# Patient Record
Sex: Female | Born: 1985
Health system: Southern US, Community
[De-identification: ages and names within clinical notes are randomized; demographics above are authoritative.]

---

## 2006-11-18 ENCOUNTER — Emergency Department (HOSPITAL_COMMUNITY): Admission: EM | Admit: 2006-11-18 | Discharge: 2006-11-18 | Payer: Self-pay | Admitting: Emergency Medicine

## 2006-11-20 ENCOUNTER — Emergency Department (HOSPITAL_COMMUNITY): Admission: EM | Admit: 2006-11-20 | Discharge: 2006-11-21 | Payer: Self-pay | Admitting: Emergency Medicine

## 2009-03-10 ENCOUNTER — Encounter: Admission: RE | Admit: 2009-03-10 | Discharge: 2009-03-10 | Payer: Self-pay | Admitting: Family Medicine

## 2009-05-08 ENCOUNTER — Emergency Department (HOSPITAL_COMMUNITY): Admission: EM | Admit: 2009-05-08 | Discharge: 2009-05-08 | Payer: Self-pay | Admitting: Emergency Medicine

## 2010-05-23 ENCOUNTER — Encounter: Payer: Self-pay | Admitting: Family Medicine

## 2011-02-14 LAB — BASIC METABOLIC PANEL
BUN: 12
CO2: 25
Calcium: 9.6
Creatinine, Ser: 1.01
Glucose, Bld: 78

## 2011-02-14 LAB — POCT PREGNANCY, URINE: Preg Test, Ur: NEGATIVE

## 2011-02-14 LAB — CBC
HCT: 42.3
MCHC: 34
Platelets: 336
RDW: 13.9
RDW: 13.9

## 2011-02-14 LAB — URINALYSIS, ROUTINE W REFLEX MICROSCOPIC
Hgb urine dipstick: NEGATIVE
Nitrite: NEGATIVE
Protein, ur: NEGATIVE
Specific Gravity, Urine: 1.026
Urobilinogen, UA: 0.2
Urobilinogen, UA: 0.2

## 2011-02-14 LAB — DIFFERENTIAL
Basophils Absolute: 0.2 — ABNORMAL HIGH
Basophils Absolute: 0.2 — ABNORMAL HIGH
Basophils Relative: 1
Eosinophils Relative: 1
Lymphocytes Relative: 29
Neutro Abs: 8.5 — ABNORMAL HIGH
Neutrophils Relative %: 55
Neutrophils Relative %: 64

## 2011-02-14 LAB — WET PREP, GENITAL
Trich, Wet Prep: NONE SEEN
Yeast Wet Prep HPF POC: NONE SEEN

## 2011-02-14 LAB — I-STAT 8, (EC8 V) (CONVERTED LAB)
Chloride: 106
Glucose, Bld: 84
Potassium: 4.3
TCO2: 29
pH, Ven: 7.382 — ABNORMAL HIGH

## 2011-02-14 LAB — POCT I-STAT CREATININE: Operator id: 196461

## 2011-02-14 LAB — GC/CHLAMYDIA PROBE AMP, GENITAL: GC Probe Amp, Genital: NEGATIVE

## 2011-02-14 LAB — URINE MICROSCOPIC-ADD ON

## 2012-05-17 DIAGNOSIS — N921 Excessive and frequent menstruation with irregular cycle: Secondary | ICD-10-CM | POA: Insufficient documentation

## 2012-08-30 DIAGNOSIS — E282 Polycystic ovarian syndrome: Secondary | ICD-10-CM | POA: Insufficient documentation

## 2012-08-30 DIAGNOSIS — N912 Amenorrhea, unspecified: Secondary | ICD-10-CM | POA: Insufficient documentation

## 2020-01-31 ENCOUNTER — Encounter: Payer: Self-pay | Admitting: Podiatry

## 2020-01-31 ENCOUNTER — Ambulatory Visit (INDEPENDENT_AMBULATORY_CARE_PROVIDER_SITE_OTHER): Payer: Self-pay

## 2020-01-31 ENCOUNTER — Ambulatory Visit (INDEPENDENT_AMBULATORY_CARE_PROVIDER_SITE_OTHER): Payer: Self-pay | Admitting: Podiatry

## 2020-01-31 ENCOUNTER — Other Ambulatory Visit: Payer: Self-pay

## 2020-01-31 DIAGNOSIS — M21611 Bunion of right foot: Secondary | ICD-10-CM

## 2020-01-31 DIAGNOSIS — M21619 Bunion of unspecified foot: Secondary | ICD-10-CM

## 2020-01-31 DIAGNOSIS — M21621 Bunionette of right foot: Secondary | ICD-10-CM

## 2020-01-31 DIAGNOSIS — M21622 Bunionette of left foot: Secondary | ICD-10-CM

## 2020-01-31 NOTE — Patient Instructions (Signed)
You can use Voltaren gel to the area  Bunion  A bunion is a bump on the base of the big toe that forms when the bones of the big toe joint move out of position. Bunions may be small at first, but they often get larger over time. They can make walking painful. What are the causes? A bunion may be caused by:  Wearing narrow or pointed shoes that force the big toe to press against the other toes.  Abnormal foot development that causes the foot to roll inward (pronate).  Changes in the foot that are caused by certain diseases, such as rheumatoid arthritis or polio.  A foot injury. What increases the risk? The following factors may make you more likely to develop this condition:  Wearing shoes that squeeze the toes together.  Having certain diseases, such as: ? Rheumatoid arthritis. ? Polio. ? Cerebral palsy.  Having family members who have bunions.  Being born with a foot deformity, such as flat feet or low arches.  Doing activities that put a lot of pressure on the feet, such as ballet dancing. What are the signs or symptoms? The main symptom of a bunion is a noticeable bump on the big toe. Other symptoms may include:  Pain.  Swelling around the big toe.  Redness and inflammation.  Thick or hardened skin on the big toe or between the toes.  Stiffness or loss of motion in the big toe.  Trouble with walking. How is this diagnosed? A bunion may be diagnosed based on your symptoms, medical history, and activities. You may have tests, such as:  X-rays. These allow your health care provider to check the position of the bones in your foot and look for damage to your joint. They also help your health care provider determine the severity of your bunion and the best way to treat it.  Joint aspiration. In this test, a sample of fluid is removed from the toe joint. This test may be done if you are in a lot of pain. It helps rule out diseases that cause painful swelling of the  joints, such as arthritis. How is this treated? Treatment depends on the severity of your symptoms. The goal of treatment is to relieve symptoms and prevent the bunion from getting worse. Your health care provider may recommend:  Wearing shoes that have a wide toe box.  Using bunion pads to cushion the affected area.  Taping your toes together to keep them in a normal position.  Placing a device inside your shoe (orthotics) to help reduce pressure on your toe joint.  Taking medicine to ease pain, inflammation, and swelling.  Applying heat or ice to the affected area.  Doing stretching exercises.  Surgery to remove scar tissue and move the toes back into their normal position. This treatment is rare. Follow these instructions at home: Managing pain, stiffness, and swelling   If directed, put ice on the painful area: ? Put ice in a plastic bag. ? Place a towel between your skin and the bag. ? Leave the ice on for 20 minutes, 2-3 times a day. Activity   If directed, apply heat to the affected area before you exercise. Use the heat source that your health care provider recommends, such as a moist heat pack or a heating pad. ? Place a towel between your skin and the heat source. ? Leave the heat on for 20-30 minutes. ? Remove the heat if your skin turns bright red. This is  especially important if you are unable to feel pain, heat, or cold. You may have a greater risk of getting burned.  Do exercises as told by your health care provider. General instructions  Support your toe joint with proper footwear, shoe padding, or taping as told by your health care provider.  Take over-the-counter and prescription medicines only as told by your health care provider.  Keep all follow-up visits as told by your health care provider. This is important. Contact a health care provider if your symptoms:  Get worse.  Do not improve in 2 weeks. Get help right away if you have:  Severe pain  and trouble with walking. Summary  A bunion is a bump on the base of the big toe that forms when the bones of the big toe joint move out of position.  Bunions can make walking painful.  Treatment depends on the severity of your symptoms.  Support your toe joint with proper footwear, shoe padding, or taping as told by your health care provider. This information is not intended to replace advice given to you by your health care provider. Make sure you discuss any questions you have with your health care provider. Document Revised: 10/23/2017 Document Reviewed: 08/29/2017 Elsevier Patient Education  2020 [employer].

## 2020-02-04 NOTE — Progress Notes (Signed)
Subjective:   Patient ID: Misty Sutton, female   DOB: 34 y.o.   MRN: [id number]   HPI 34 year old female presents the office today for concerns of pain to the lateral aspect of right foot which than on the last 2 to 3 months. She states it hurts worse when she is wearing heels or wedges. She denies recent injury or trauma. No recent treatment. She states that supportive shoes feel better. No other concerns today.   Review of Systems  All other systems reviewed and are negative.  History reviewed. No pertinent past medical history.  History reviewed. No pertinent surgical history.   Current Outpatient Medications:  .  metFORMIN (GLUCOPHAGE-XR) 750 MG 24 hr tablet, Take one tablet PO daily for 1-2 weeks, then increase to BID daily, Disp: , Rfl:  .  progesterone (PROMETRIUM) 100 MG capsule, Take by mouth., Disp: , Rfl:  .  medroxyPROGESTERone (PROVERA) 10 MG tablet, Take by mouth., Disp: , Rfl:  .  misoprostol (CYTOTEC) 200 MCG tablet, SMARTSIG:4 Tablet(s) Vaginal, Disp: , Rfl:  .  Multiple Vitamin (MULTIVITAMIN) tablet, Take 1 tablet by mouth daily., Disp: , Rfl:  .  traMADol (ULTRAM) 50 MG tablet, Take 50 mg by mouth every 6 (six) hours as needed., Disp: , Rfl:   Allergies  Allergen Reactions  . Paba Derivatives Itching and Rash  . Other Other (See Comments)    States a medicine taken in the last 8 months caused tachycardia.  Unknown name of medication to patient - states "benzo-something"  Patient cannot remember who prescribed.  Patient cannot remember what she was taking it for.  Patient is going to try to investigate and let us know.  Patient states reaction scared her.        Objective:  Physical Exam  General: AAO x3, NAD  Dermatological: Skin is warm, dry and supple bilateral.  There are no open sores, no preulcerative lesions, no rash or signs of infection present.  Vascular: Dorsalis Pedis artery and Posterior Tibial artery pedal pulses are 2/4 bilateral with immedate  capillary fill time.  There is no pain with calf compression, swelling, warmth, erythema.   Neruologic: Grossly intact via light touch bilateral.   Musculoskeletal: Majority tenderness is lateral aspect the foot along the fifth metatarsal head on the area tailor's bunion. Minimal discomfort the fifth metatarsal base. Flexor, extensor tendons appear to be intact. Bunion deformities present as well. There is no significant edema, erythema. Muscular strength 5/5 in all groups tested bilateral.  Gait: Unassisted, Nonantalgic.       Assessment:   34-year female tailor's bunion, tendinitis right foot     Plan:  -Treatment options discussed including all alternatives, risks, and complications -Etiology of symptoms were discussed -X-rays were obtained and reviewed with the patient. There is no evidence of acute fracture. Bony deformity is evident. Awaiting radiology report. -We discussed the modifications and orthotics. Offered steroid injection we held off on this today. Voltaren gel as needed. -In the future consider surgical intervention if needed.  Vivi Barrack DPM

## 2020-05-25 DIAGNOSIS — N970 Female infertility associated with anovulation: Secondary | ICD-10-CM | POA: Diagnosis not present

## 2020-05-25 DIAGNOSIS — E282 Polycystic ovarian syndrome: Secondary | ICD-10-CM | POA: Diagnosis not present

## 2020-06-09 ENCOUNTER — Other Ambulatory Visit: Payer: Self-pay

## 2020-06-09 ENCOUNTER — Ambulatory Visit (INDEPENDENT_AMBULATORY_CARE_PROVIDER_SITE_OTHER): Payer: BC Managed Care – PPO

## 2020-06-09 ENCOUNTER — Ambulatory Visit (INDEPENDENT_AMBULATORY_CARE_PROVIDER_SITE_OTHER): Payer: BC Managed Care – PPO | Admitting: Sports Medicine

## 2020-06-09 DIAGNOSIS — S8991XA Unspecified injury of right lower leg, initial encounter: Secondary | ICD-10-CM

## 2020-06-09 DIAGNOSIS — M25561 Pain in right knee: Secondary | ICD-10-CM | POA: Diagnosis not present

## 2020-06-09 DIAGNOSIS — M1712 Unilateral primary osteoarthritis, left knee: Secondary | ICD-10-CM | POA: Diagnosis not present

## 2020-06-09 DIAGNOSIS — G8929 Other chronic pain: Secondary | ICD-10-CM | POA: Diagnosis not present

## 2020-06-09 MED ORDER — MELOXICAM 15 MG PO TABS: ORAL_TABLET | ORAL | 3 refills | Status: AC

## 2020-06-09 NOTE — Progress Notes (Signed)
    Procedures performed today:    None.  Independent interpretation of notes and tests performed by another provider:   None.  Brief History, Exam, Impression, and Recommendations:    Right knee injury This is a pleasant 35 year old female, couple weeks ago she slipped and fell on the ice, twisting her knee, she had immediate pain, swelling. Today she has discomfort at the medial joint line, effusion, positive McMurray sign, pain with terminal flexion, as well as a loose ACL with a positive Lachman sign. Adding meloxicam, x-rays, MRI, she will need a reaction knee brace. Of note she is currently going through IVF. She has an embryo transfer coming up at the end of the month, I did text her reproductive endocrinologist but I do not think there is any contraindications for NSAIDs at this juncture.    ___________________________________________ Ihor Austin. Benjamin Stain, M.D., ABFM., CAQSM. Primary Care and Sports Medicine [facility]  Adjunct Instructor of Family Medicine  [employer]

## 2020-06-09 NOTE — Assessment & Plan Note (Signed)
This is a pleasant 35 year old female, couple weeks ago she slipped and fell on the ice, twisting her knee, she had immediate pain, swelling. Today she has discomfort at the medial joint line, effusion, positive McMurray sign, pain with terminal flexion, as well as a loose ACL with a positive Lachman sign. Adding meloxicam, x-rays, MRI, she will need a reaction knee brace. Of note she is currently going through IVF. She has an embryo transfer coming up at the end of the month, I did text her reproductive endocrinologist but I do not think there is any contraindications for NSAIDs at this juncture.

## 2020-06-14 ENCOUNTER — Ambulatory Visit (INDEPENDENT_AMBULATORY_CARE_PROVIDER_SITE_OTHER): Payer: BC Managed Care – PPO

## 2020-06-14 ENCOUNTER — Other Ambulatory Visit: Payer: Self-pay

## 2020-06-14 DIAGNOSIS — S8991XA Unspecified injury of right lower leg, initial encounter: Secondary | ICD-10-CM

## 2020-06-14 DIAGNOSIS — M179 Osteoarthritis of knee, unspecified: Secondary | ICD-10-CM | POA: Diagnosis not present

## 2020-06-14 DIAGNOSIS — M25461 Effusion, right knee: Secondary | ICD-10-CM | POA: Diagnosis not present

## 2020-06-14 DIAGNOSIS — R600 Localized edema: Secondary | ICD-10-CM | POA: Diagnosis not present

## 2020-06-14 DIAGNOSIS — M1711 Unilateral primary osteoarthritis, right knee: Secondary | ICD-10-CM | POA: Diagnosis not present

## 2020-06-19 DIAGNOSIS — Z113 Encounter for screening for infections with a predominantly sexual mode of transmission: Secondary | ICD-10-CM | POA: Diagnosis not present

## 2020-06-29 DIAGNOSIS — N970 Female infertility associated with anovulation: Secondary | ICD-10-CM | POA: Diagnosis not present

## 2020-06-29 DIAGNOSIS — E282 Polycystic ovarian syndrome: Secondary | ICD-10-CM | POA: Diagnosis not present

## 2020-06-29 DIAGNOSIS — N979 Female infertility, unspecified: Secondary | ICD-10-CM | POA: Diagnosis not present

## 2020-07-07 DIAGNOSIS — Z113 Encounter for screening for infections with a predominantly sexual mode of transmission: Secondary | ICD-10-CM | POA: Diagnosis not present

## 2020-07-09 DIAGNOSIS — Z3201 Encounter for pregnancy test, result positive: Secondary | ICD-10-CM | POA: Diagnosis not present

## 2020-07-09 DIAGNOSIS — Z3200 Encounter for pregnancy test, result unknown: Secondary | ICD-10-CM | POA: Diagnosis not present

## 2020-07-21 DIAGNOSIS — Z3200 Encounter for pregnancy test, result unknown: Secondary | ICD-10-CM | POA: Diagnosis not present

## 2020-08-04 DIAGNOSIS — O0900 Supervision of pregnancy with history of infertility, unspecified trimester: Secondary | ICD-10-CM | POA: Diagnosis not present

## 2020-08-20 DIAGNOSIS — O26851 Spotting complicating pregnancy, first trimester: Secondary | ICD-10-CM | POA: Diagnosis not present

## 2020-08-24 DIAGNOSIS — Z3480 Encounter for supervision of other normal pregnancy, unspecified trimester: Secondary | ICD-10-CM | POA: Diagnosis not present

## 2020-08-24 DIAGNOSIS — Z124 Encounter for screening for malignant neoplasm of cervix: Secondary | ICD-10-CM | POA: Diagnosis not present

## 2020-08-24 DIAGNOSIS — Z8742 Personal history of other diseases of the female genital tract: Secondary | ICD-10-CM | POA: Diagnosis not present

## 2020-10-21 DIAGNOSIS — O09522 Supervision of elderly multigravida, second trimester: Secondary | ICD-10-CM | POA: Diagnosis not present

## 2020-10-21 DIAGNOSIS — O99212 Obesity complicating pregnancy, second trimester: Secondary | ICD-10-CM | POA: Diagnosis not present

## 2020-10-21 DIAGNOSIS — O09812 Supervision of pregnancy resulting from assisted reproductive technology, second trimester: Secondary | ICD-10-CM | POA: Diagnosis not present

## 2020-10-23 DIAGNOSIS — Z3480 Encounter for supervision of other normal pregnancy, unspecified trimester: Secondary | ICD-10-CM | POA: Diagnosis not present

## 2020-10-23 DIAGNOSIS — O09529 Supervision of elderly multigravida, unspecified trimester: Secondary | ICD-10-CM | POA: Diagnosis not present

## 2020-11-18 DIAGNOSIS — O99212 Obesity complicating pregnancy, second trimester: Secondary | ICD-10-CM | POA: Diagnosis not present

## 2020-11-18 DIAGNOSIS — O321XX0 Maternal care for breech presentation, not applicable or unspecified: Secondary | ICD-10-CM | POA: Diagnosis not present

## 2020-11-18 DIAGNOSIS — Z3A23 23 weeks gestation of pregnancy: Secondary | ICD-10-CM | POA: Diagnosis not present

## 2020-12-15 DIAGNOSIS — G44211 Episodic tension-type headache, intractable: Secondary | ICD-10-CM | POA: Diagnosis not present

## 2020-12-15 DIAGNOSIS — O26893 Other specified pregnancy related conditions, third trimester: Secondary | ICD-10-CM | POA: Diagnosis not present

## 2020-12-15 DIAGNOSIS — O163 Unspecified maternal hypertension, third trimester: Secondary | ICD-10-CM | POA: Diagnosis not present

## 2020-12-15 DIAGNOSIS — I10 Essential (primary) hypertension: Secondary | ICD-10-CM | POA: Diagnosis not present

## 2020-12-15 DIAGNOSIS — Z6841 Body Mass Index (BMI) 40.0 and over, adult: Secondary | ICD-10-CM | POA: Diagnosis not present

## 2020-12-15 DIAGNOSIS — R030 Elevated blood-pressure reading, without diagnosis of hypertension: Secondary | ICD-10-CM | POA: Diagnosis not present

## 2020-12-15 DIAGNOSIS — O26892 Other specified pregnancy related conditions, second trimester: Secondary | ICD-10-CM | POA: Diagnosis not present

## 2020-12-15 DIAGNOSIS — Z3A28 28 weeks gestation of pregnancy: Secondary | ICD-10-CM | POA: Diagnosis not present

## 2020-12-17 DIAGNOSIS — Z3480 Encounter for supervision of other normal pregnancy, unspecified trimester: Secondary | ICD-10-CM | POA: Diagnosis not present

## 2020-12-20 IMAGING — DX DG FOOT 2V*R*
2 series · 2 of 2 positions shown · non-contrast
Comparison: None.

CLINICAL DATA: Right foot pain.

EXAM:
RIGHT FOOT - 2 VIEW

[foot lat]
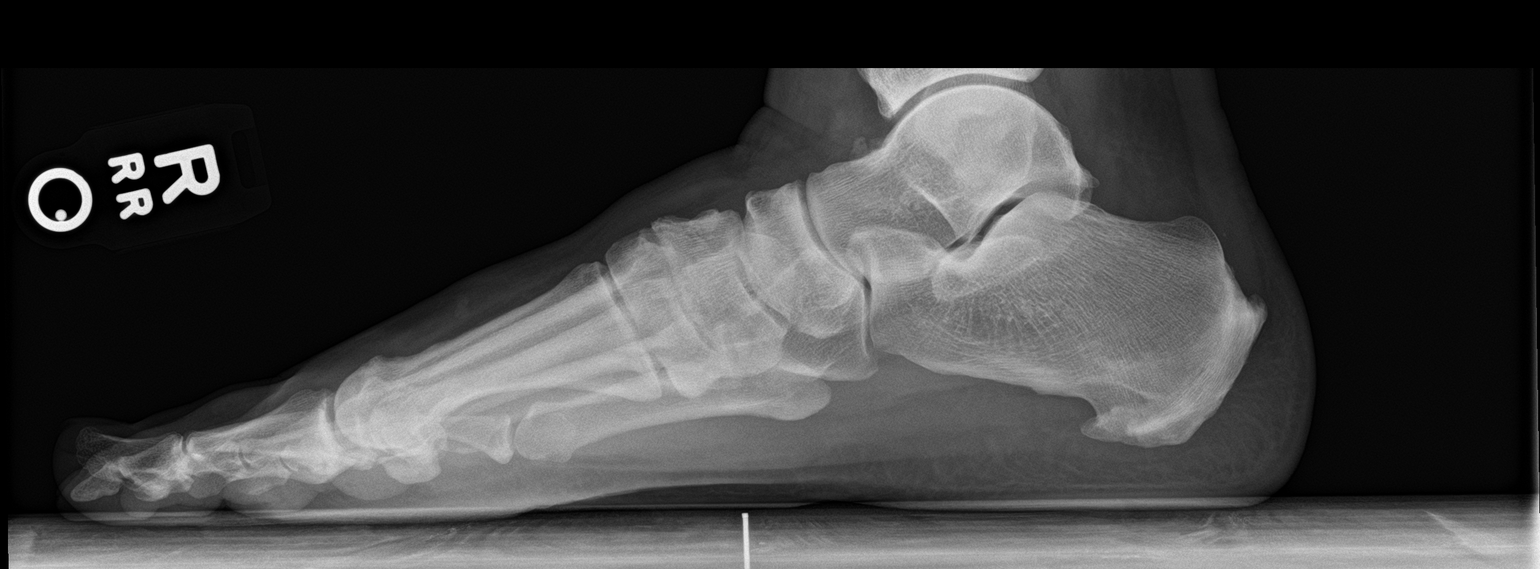

[foot ap]
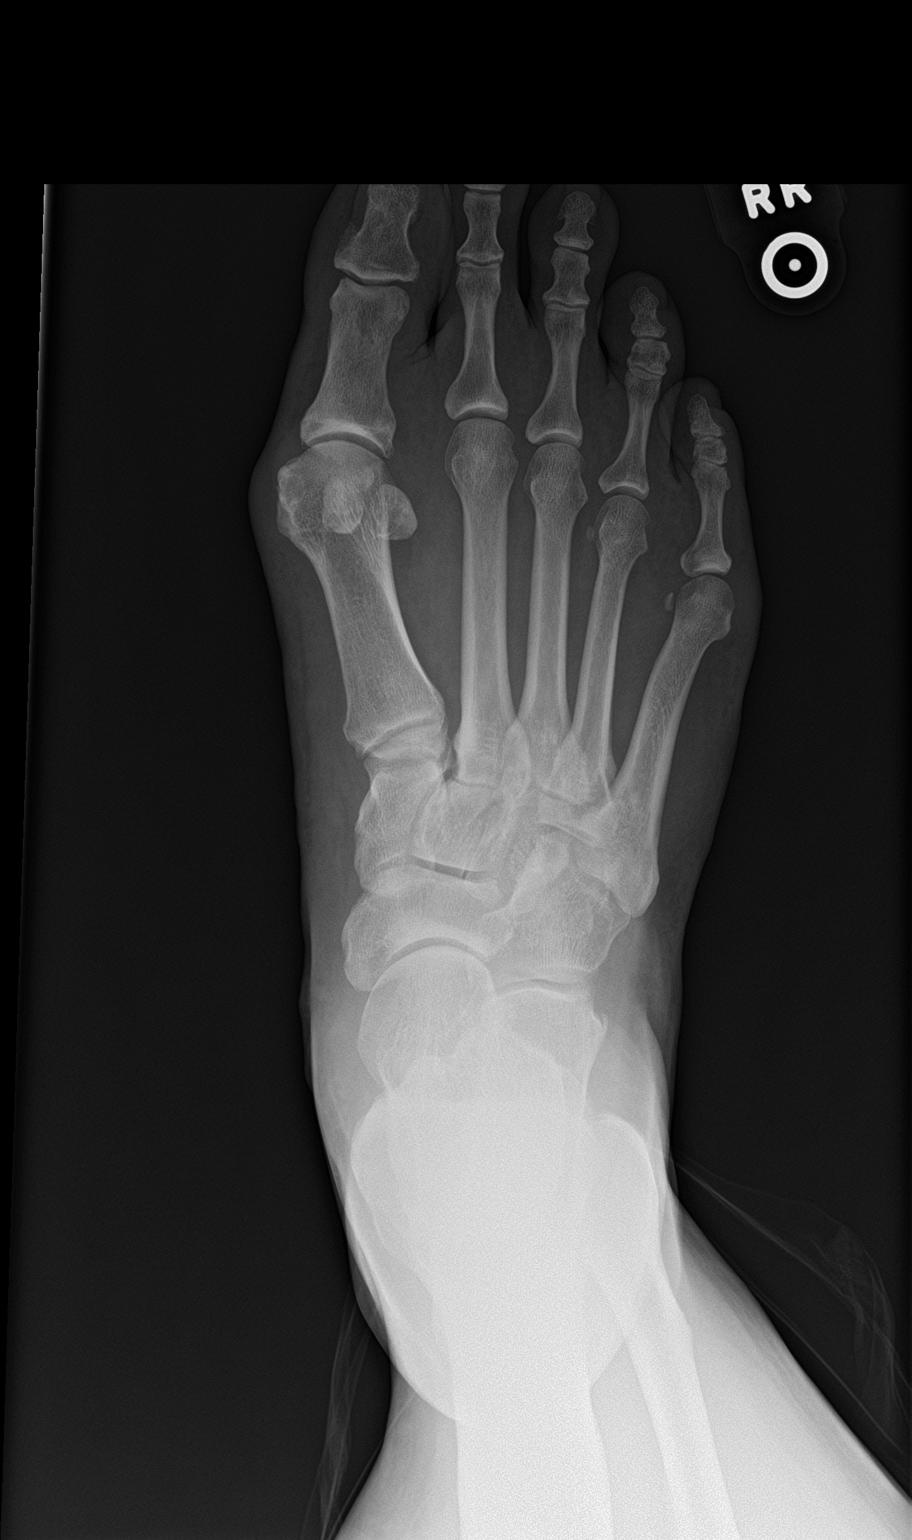

[2 of 2 positions shown; findings below may reference images not displayed]

FINDINGS: Mild degenerative changes at the first MTP joint with a mild hallux
valgus deformity. The other MTP joints are maintained.

Mild/early midfoot degenerative changes but no fracture or erosive
changes. Calcaneal spurring changes noted.
IMPRESSION: Mild degenerative changes at the first MTP joint and mild hallux
valgus deformity.

## 2021-01-07 DIAGNOSIS — R760 Raised antibody titer: Secondary | ICD-10-CM | POA: Diagnosis not present

## 2021-01-14 DIAGNOSIS — Z3A31 31 weeks gestation of pregnancy: Secondary | ICD-10-CM | POA: Diagnosis not present

## 2021-01-14 DIAGNOSIS — O99213 Obesity complicating pregnancy, third trimester: Secondary | ICD-10-CM | POA: Diagnosis not present

## 2021-01-19 DIAGNOSIS — O09813 Supervision of pregnancy resulting from assisted reproductive technology, third trimester: Secondary | ICD-10-CM | POA: Diagnosis not present

## 2021-01-19 DIAGNOSIS — O09819 Supervision of pregnancy resulting from assisted reproductive technology, unspecified trimester: Secondary | ICD-10-CM | POA: Diagnosis not present

## 2021-01-21 DIAGNOSIS — Z6841 Body Mass Index (BMI) 40.0 and over, adult: Secondary | ICD-10-CM | POA: Diagnosis not present

## 2021-01-21 DIAGNOSIS — O99213 Obesity complicating pregnancy, third trimester: Secondary | ICD-10-CM | POA: Diagnosis not present

## 2021-01-21 DIAGNOSIS — Z3A32 32 weeks gestation of pregnancy: Secondary | ICD-10-CM | POA: Diagnosis not present

## 2021-01-28 DIAGNOSIS — O99213 Obesity complicating pregnancy, third trimester: Secondary | ICD-10-CM | POA: Diagnosis not present

## 2021-01-28 DIAGNOSIS — Z3A33 33 weeks gestation of pregnancy: Secondary | ICD-10-CM | POA: Diagnosis not present

## 2021-01-28 DIAGNOSIS — Z6841 Body Mass Index (BMI) 40.0 and over, adult: Secondary | ICD-10-CM | POA: Diagnosis not present

## 2021-02-04 DIAGNOSIS — Z6841 Body Mass Index (BMI) 40.0 and over, adult: Secondary | ICD-10-CM | POA: Diagnosis not present

## 2021-02-04 DIAGNOSIS — Z3A34 34 weeks gestation of pregnancy: Secondary | ICD-10-CM | POA: Diagnosis not present

## 2021-02-04 DIAGNOSIS — O99213 Obesity complicating pregnancy, third trimester: Secondary | ICD-10-CM | POA: Diagnosis not present

## 2021-02-05 DIAGNOSIS — Z6791 Unspecified blood type, Rh negative: Secondary | ICD-10-CM | POA: Diagnosis not present

## 2021-02-05 DIAGNOSIS — O133 Gestational [pregnancy-induced] hypertension without significant proteinuria, third trimester: Secondary | ICD-10-CM | POA: Diagnosis not present

## 2021-02-11 DIAGNOSIS — O133 Gestational [pregnancy-induced] hypertension without significant proteinuria, third trimester: Secondary | ICD-10-CM | POA: Diagnosis not present

## 2021-02-11 DIAGNOSIS — Z3A35 35 weeks gestation of pregnancy: Secondary | ICD-10-CM | POA: Diagnosis not present

## 2021-02-11 DIAGNOSIS — O99213 Obesity complicating pregnancy, third trimester: Secondary | ICD-10-CM | POA: Diagnosis not present

## 2021-02-18 DIAGNOSIS — O99213 Obesity complicating pregnancy, third trimester: Secondary | ICD-10-CM | POA: Diagnosis not present

## 2021-02-18 DIAGNOSIS — O133 Gestational [pregnancy-induced] hypertension without significant proteinuria, third trimester: Secondary | ICD-10-CM | POA: Diagnosis not present

## 2021-02-18 DIAGNOSIS — Z8742 Personal history of other diseases of the female genital tract: Secondary | ICD-10-CM | POA: Diagnosis not present

## 2021-02-19 DIAGNOSIS — O1213 Gestational proteinuria, third trimester: Secondary | ICD-10-CM | POA: Diagnosis not present

## 2021-02-19 DIAGNOSIS — Z3480 Encounter for supervision of other normal pregnancy, unspecified trimester: Secondary | ICD-10-CM | POA: Diagnosis not present

## 2021-02-24 DIAGNOSIS — O26893 Other specified pregnancy related conditions, third trimester: Secondary | ICD-10-CM | POA: Diagnosis not present

## 2021-02-24 DIAGNOSIS — O99214 Obesity complicating childbirth: Secondary | ICD-10-CM | POA: Diagnosis not present

## 2021-02-24 DIAGNOSIS — Z28310 Unvaccinated for covid-19: Secondary | ICD-10-CM | POA: Diagnosis not present

## 2021-02-24 DIAGNOSIS — M7989 Other specified soft tissue disorders: Secondary | ICD-10-CM | POA: Diagnosis not present

## 2021-02-24 DIAGNOSIS — O99284 Endocrine, nutritional and metabolic diseases complicating childbirth: Secondary | ICD-10-CM | POA: Diagnosis not present

## 2021-02-24 DIAGNOSIS — M533 Sacrococcygeal disorders, not elsewhere classified: Secondary | ICD-10-CM | POA: Diagnosis not present

## 2021-02-24 DIAGNOSIS — Z3A37 37 weeks gestation of pregnancy: Secondary | ICD-10-CM | POA: Diagnosis not present

## 2021-02-24 DIAGNOSIS — O99892 Other specified diseases and conditions complicating childbirth: Secondary | ICD-10-CM | POA: Diagnosis not present

## 2021-02-24 DIAGNOSIS — Z3483 Encounter for supervision of other normal pregnancy, third trimester: Secondary | ICD-10-CM | POA: Diagnosis not present

## 2021-02-24 DIAGNOSIS — O134 Gestational [pregnancy-induced] hypertension without significant proteinuria, complicating childbirth: Secondary | ICD-10-CM | POA: Diagnosis not present

## 2021-02-24 DIAGNOSIS — Z2882 Immunization not carried out because of caregiver refusal: Secondary | ICD-10-CM | POA: Diagnosis not present

## 2021-02-24 DIAGNOSIS — Z6741 Type O blood, Rh negative: Secondary | ICD-10-CM | POA: Diagnosis not present

## 2021-02-24 DIAGNOSIS — E282 Polycystic ovarian syndrome: Secondary | ICD-10-CM | POA: Diagnosis not present

## 2021-04-06 DIAGNOSIS — R82998 Other abnormal findings in urine: Secondary | ICD-10-CM | POA: Diagnosis not present

## 2021-04-06 DIAGNOSIS — R3915 Urgency of urination: Secondary | ICD-10-CM | POA: Diagnosis not present

## 2021-04-29 IMAGING — DX DG KNEE 1-2V*L*
2 series · 2 of 2 positions shown · non-contrast
Comparison: Right knee today

CLINICAL DATA: Chronic right knee pain

EXAM:
LEFT KNEE - 1-2 VIEW

[tunnel]
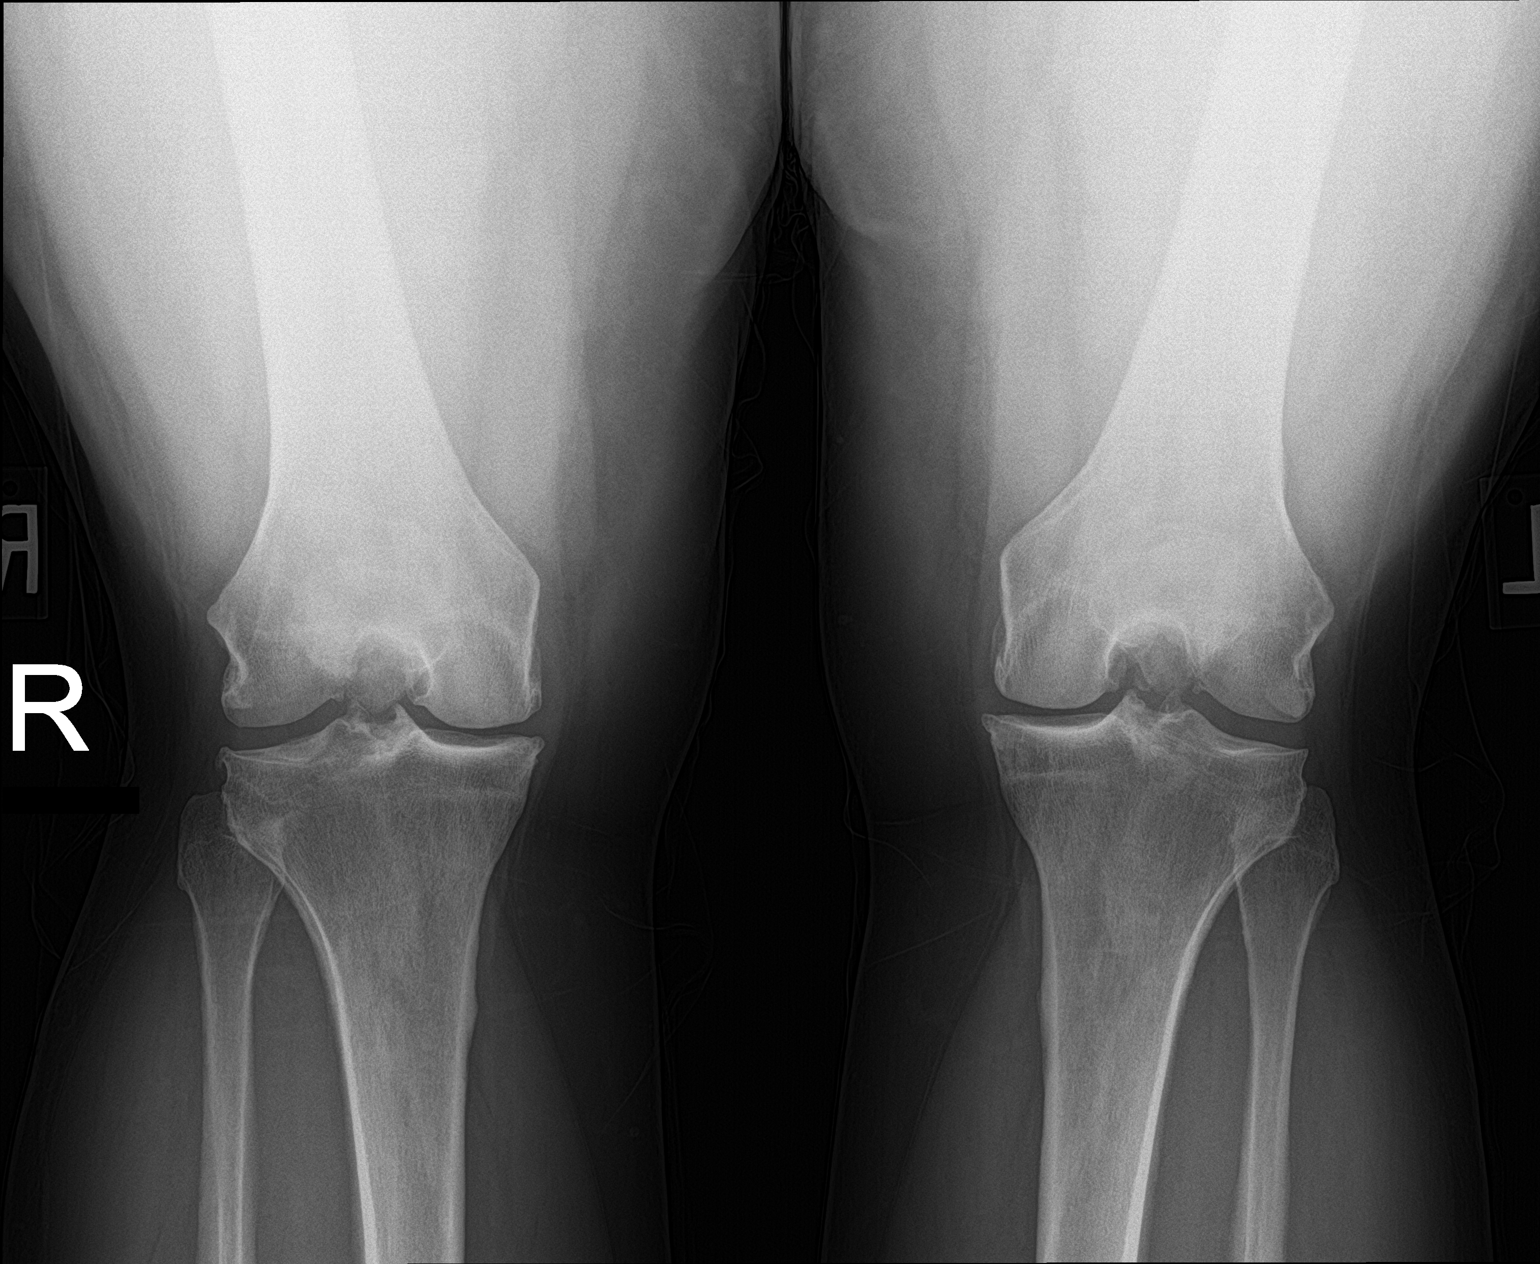

[knee ap bilat standing]
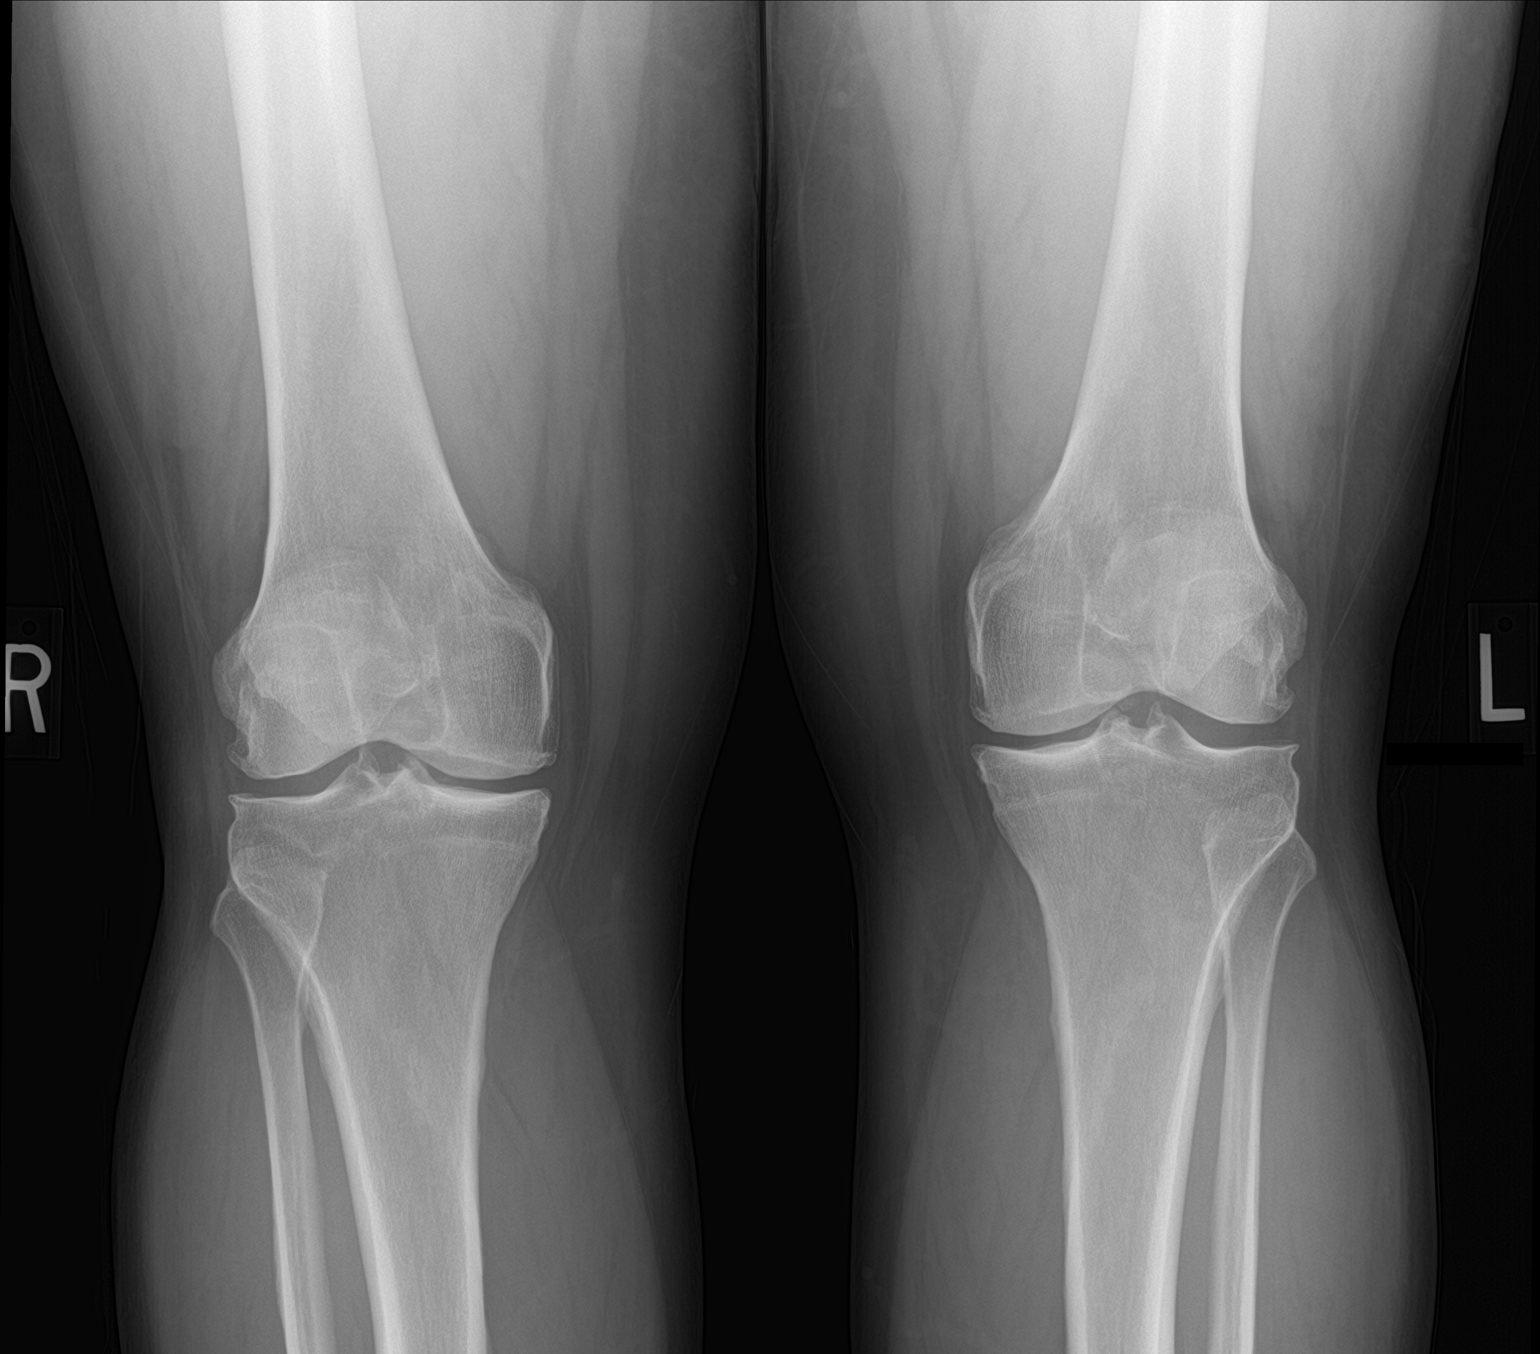

[2 of 2 positions shown; findings below may reference images not displayed]

FINDINGS: Joint space narrowing and spurring, slightly more pronounced in the
medial compartment and symmetric to the right knee. No acute bony
abnormality. Specifically, no fracture, subluxation, or dislocation.
IMPRESSION: Mild degenerative changes, symmetric to the right knee. No acute
bony abnormality.

## 2021-09-09 DIAGNOSIS — N939 Abnormal uterine and vaginal bleeding, unspecified: Secondary | ICD-10-CM | POA: Diagnosis not present

## 2021-10-07 DIAGNOSIS — Z01812 Encounter for preprocedural laboratory examination: Secondary | ICD-10-CM | POA: Diagnosis not present

## 2021-10-07 DIAGNOSIS — N939 Abnormal uterine and vaginal bleeding, unspecified: Secondary | ICD-10-CM | POA: Diagnosis not present

## 2021-11-04 DIAGNOSIS — C44629 Squamous cell carcinoma of skin of left upper limb, including shoulder: Secondary | ICD-10-CM | POA: Diagnosis not present

## 2021-11-04 DIAGNOSIS — D225 Melanocytic nevi of trunk: Secondary | ICD-10-CM | POA: Diagnosis not present

## 2021-11-04 DIAGNOSIS — L821 Other seborrheic keratosis: Secondary | ICD-10-CM | POA: Diagnosis not present

## 2021-11-04 DIAGNOSIS — L578 Other skin changes due to chronic exposure to nonionizing radiation: Secondary | ICD-10-CM | POA: Diagnosis not present

## 2021-11-04 DIAGNOSIS — L814 Other melanin hyperpigmentation: Secondary | ICD-10-CM | POA: Diagnosis not present

## 2021-12-29 DIAGNOSIS — Z319 Encounter for procreative management, unspecified: Secondary | ICD-10-CM | POA: Diagnosis not present

## 2021-12-29 DIAGNOSIS — E282 Polycystic ovarian syndrome: Secondary | ICD-10-CM | POA: Diagnosis not present

## 2021-12-29 DIAGNOSIS — N915 Oligomenorrhea, unspecified: Secondary | ICD-10-CM | POA: Diagnosis not present

## 2022-01-26 DIAGNOSIS — E282 Polycystic ovarian syndrome: Secondary | ICD-10-CM | POA: Diagnosis not present

## 2022-01-26 DIAGNOSIS — N711 Chronic inflammatory disease of uterus: Secondary | ICD-10-CM | POA: Diagnosis not present

## 2022-01-26 DIAGNOSIS — Z3141 Encounter for fertility testing: Secondary | ICD-10-CM | POA: Diagnosis not present

## 2022-03-09 DIAGNOSIS — Z3200 Encounter for pregnancy test, result unknown: Secondary | ICD-10-CM | POA: Diagnosis not present

## 2022-03-10 DIAGNOSIS — Z3200 Encounter for pregnancy test, result unknown: Secondary | ICD-10-CM | POA: Diagnosis not present

## 2022-03-23 DIAGNOSIS — Z3200 Encounter for pregnancy test, result unknown: Secondary | ICD-10-CM | POA: Diagnosis not present

## 2022-03-30 DIAGNOSIS — O26851 Spotting complicating pregnancy, first trimester: Secondary | ICD-10-CM | POA: Diagnosis not present

## 2022-04-07 DIAGNOSIS — O0900 Supervision of pregnancy with history of infertility, unspecified trimester: Secondary | ICD-10-CM | POA: Diagnosis not present

## 2022-04-13 DIAGNOSIS — R109 Unspecified abdominal pain: Secondary | ICD-10-CM | POA: Diagnosis not present

## 2022-04-13 DIAGNOSIS — R82998 Other abnormal findings in urine: Secondary | ICD-10-CM | POA: Diagnosis not present
